# Patient Record
Sex: Male | Born: 1950 | Race: Black or African American | Hispanic: No | Marital: Single | Smoking: Never smoker
Health system: Southern US, Community
[De-identification: ages and names within clinical notes are randomized; demographics above are authoritative.]

---

## 2015-01-26 ENCOUNTER — Ambulatory Visit
Admission: RE | Admit: 2015-01-26 | Discharge: 2015-01-26 | Disposition: A | Discharge status: Home / Self Care | Payer: No Typology Code available for payment source | Source: Ambulatory Visit | Attending: Infectious Disease | Admitting: Infectious Disease

## 2015-01-26 ENCOUNTER — Other Ambulatory Visit: Payer: Self-pay | Admitting: Infectious Disease

## 2015-01-26 DIAGNOSIS — Z139 Encounter for screening, unspecified: Secondary | ICD-10-CM

## 2015-04-13 ENCOUNTER — Other Ambulatory Visit: Payer: Self-pay | Admitting: Physician Assistant

## 2015-04-13 DIAGNOSIS — R221 Localized swelling, mass and lump, neck: Secondary | ICD-10-CM

## 2015-06-13 ENCOUNTER — Ambulatory Visit
Admission: RE | Admit: 2015-06-13 | Discharge: 2015-06-13 | Disposition: A | Discharge status: Home / Self Care | Payer: Medicaid Other | Source: Ambulatory Visit | Attending: Physician Assistant | Admitting: Physician Assistant

## 2015-06-13 ENCOUNTER — Inpatient Hospital Stay: Admission: RE | Admit: 2015-06-13 | Payer: Self-pay | Source: Ambulatory Visit

## 2015-06-13 DIAGNOSIS — R221 Localized swelling, mass and lump, neck: Secondary | ICD-10-CM

## 2015-06-13 MED ORDER — IOPAMIDOL (ISOVUE-300) INJECTION 61%
75.0000 mL | Freq: Once | INTRAVENOUS | Status: AC | PRN
  Administered 2015-06-13: 75 mL via INTRAVENOUS

## 2015-11-26 ENCOUNTER — Telehealth: Payer: Self-pay | Admitting: *Deleted

## 2015-11-26 NOTE — Congregational Nurse Program (Signed)
Congregational Nurse Program Note  Date of Encounter: 10/30/2015 Encounter completed with the assistance of an interpreter.  Client speaks Burmese.   C/O of constipation.  Discussed eating more food high in fiber and increase water intake.  States is experiencing pain from foot to waist.  States pain medication is not working.  Has an appointment 10/18.  Encouraged client to keep appointment and discuss pain Past Medical History: No past medical history on file.  Encounter Details:     CNP Questionnaire - 10/30/15 0946      Patient Demographics   Is this a new or existing patient? New   Patient is considered a/an Refugee   Race Other     Patient Assistance   Location of Patient Assistance Not Applicable   Patient's financial/insurance status Low Income;Medicaid   Uninsured Patient (Orange Research officer, trade unionCard/Care Connects) No   Patient referred to apply for the following financial assistance Not Applicable   Food insecurities addressed Not Applicable   Transportation assistance No   Assistance securing medications No   Educational health offerings Acute disease;Navigating the healthcare system     Encounter Details   Primary purpose of visit Chronic Illness/Condition Visit   Was an Emergency Department visit averted? Not Applicable   Does patient have a medical provider? Yes   Patient referred to Establish PCP   Was a mental health screening completed? (GAINS tool) No   Does patient have dental issues? No   Does patient have vision issues? No   Does your patient have an abnormal blood pressure today? No   Since previous encounter, have you referred patient for abnormal blood pressure that resulted in a new diagnosis or medication change? No   Does your patient have an abnormal blood glucose today? No   Since previous encounter, have you referred patient for abnormal blood glucose that resulted in a new diagnosis or medication change? No   Was there a life-saving intervention made? No

## 2015-11-26 NOTE — Telephone Encounter (Signed)
Called patient via [employer], [place] and spoke with the spouse, Samuel Gordon. They are both in class until 12:30 PM. Given a new patient appt with Dr. Luciana Axeomer for 12/19/15 at 4:15 pm. I will call back tomorrow after they get out of class with directions to [facility] and appt info. Samuel Gordon

## 2015-11-27 NOTE — Telephone Encounter (Signed)
Called patient back via [employer] ID # [id number], Samuel Gordon and he was notified of the appt date, time, and address. He would like a Swahili interpreter versus Samuel Gordon as his wife is french speaking. Samuel Gordon

## 2015-12-19 ENCOUNTER — Ambulatory Visit (INDEPENDENT_AMBULATORY_CARE_PROVIDER_SITE_OTHER): Payer: Medicaid Other | Admitting: Internal Medicine

## 2015-12-19 ENCOUNTER — Encounter: Payer: Self-pay | Admitting: Internal Medicine

## 2015-12-19 DIAGNOSIS — B182 Chronic viral hepatitis C: Secondary | ICD-10-CM | POA: Diagnosis present

## 2015-12-19 NOTE — Progress Notes (Signed)
    [facility]      Reason for Consult: chronic hepatitis C    Referring Physician: PCP    Patient ID: Samuel Gordon, male    DOB: 01/08/1951, 65 y.o.   MRN: [id number]  HPI:   Here for evaluuation as a new patient with hepatitis C.  No records available of confirmation.  He was told about this when he was called by us to make an appointment.  His interview is via interpreter.  He does not recall any previous test for hepatitis C.  No known family members with hepatitis C and he is unaware of how he got it.  No history of blood transfusion, no tattoos, no shared needles. From the [place] originally.  Learning English.  Interested in treatment    No past medical history on file.  Prior to Admission medications   Not on File    No Known Allergies  Social History  Substance Use Topics  . Smoking status: Never Smoker  . Smokeless tobacco: Never Used  . Alcohol use No    FMH: no known liver disease  Review of Systems  Constitutional: negative for fatigue, malaise, anorexia and weight loss Gastrointestinal: negative for nausea and diarrhea Integument/breast: negative for rash Musculoskeletal: negative for myalgias and arthralgias All other systems reviewed and are negative    Constitutional: in no apparent distress and alert  Vitals:   12/19/15 1608  BP: (!) 164/84  Pulse: 99  Temp: 97.8 F (36.6 C)   EYES: anicteric ENMT: no thrush Cardiovascular: Cor RRR Respiratory: CTA B; normal respiratory effort GI: Bowel sounds are normal, liver is not enlarged, spleen is not enlarged Musculoskeletal: no pedal edema noted Skin: negatives: no rash Hematologic: no cervical lad  Assessment: reported hepatitis C.  I will do labs today for confirmation and then consider Mayvret for 8 or 12 weeks.    Plan: 1) labs today 2) schedule elastography RTC after starting medication

## 2015-12-20 LAB — COMPLETE METABOLIC PANEL WITH GFR
ALT: 148 U/L — AB (ref 9–46)
AST: 135 U/L — AB (ref 10–35)
Albumin: 3.7 g/dL (ref 3.6–5.1)
Alkaline Phosphatase: 100 U/L (ref 40–115)
BUN: 16 mg/dL (ref 7–25)
CHLORIDE: 98 mmol/L (ref 98–110)
CO2: 30 mmol/L (ref 20–31)
CREATININE: 1.14 mg/dL (ref 0.70–1.25)
Calcium: 9.5 mg/dL (ref 8.6–10.3)
GFR, Est African American: 78 mL/min (ref >=60)
GFR, Est Non African American: 67 mL/min (ref >=60)
GLUCOSE: 82 mg/dL (ref 65–99)
POTASSIUM: 3.9 mmol/L (ref 3.5–5.3)
SODIUM: 136 mmol/L (ref 135–146)
Total Bilirubin: 0.6 mg/dL (ref 0.2–1.2)
Total Protein: 9.3 g/dL — ABNORMAL HIGH (ref 6.1–8.1)

## 2015-12-20 LAB — CBC WITH DIFFERENTIAL/PLATELET
BASOS PCT: 1 %
Basophils Absolute: 51 cells/uL (ref 0–200)
EOS PCT: 1 %
Eosinophils Absolute: 51 cells/uL (ref 15–500)
HCT: 41 % (ref 38.5–50.0)
Hemoglobin: 14.3 g/dL (ref 13.2–17.1)
LYMPHS PCT: 57 %
Lymphs Abs: 2907 cells/uL (ref 850–3900)
MCH: 32.4 pg (ref 27.0–33.0)
MCHC: 34.9 g/dL (ref 32.0–36.0)
MCV: 92.8 fL (ref 80.0–100.0)
MPV: 9.5 fL (ref 7.5–12.5)
Monocytes Absolute: 510 cells/uL (ref 200–950)
Monocytes Relative: 10 %
NEUTROS PCT: 31 %
Neutro Abs: 1581 cells/uL (ref 1500–7800)
PLATELETS: 142 10*3/uL (ref 140–400)
RBC: 4.42 MIL/uL (ref 4.20–5.80)
RDW: 14.4 % (ref 11.0–15.0)
WBC: 5.1 10*3/uL (ref 3.8–10.8)

## 2015-12-20 LAB — HEPATITIS B SURFACE ANTIBODY,QUALITATIVE: Hep B S Ab: NEGATIVE

## 2015-12-20 LAB — HEPATITIS B CORE ANTIBODY, TOTAL: HEP B C TOTAL AB: REACTIVE — AB

## 2015-12-20 LAB — HIV ANTIBODY (ROUTINE TESTING W REFLEX): HIV: NONREACTIVE

## 2015-12-20 LAB — PROTIME-INR
INR: 1.2 — AB
PROTHROMBIN TIME: 12.2 s — AB (ref 9.0–11.5)

## 2015-12-20 LAB — HEPATITIS B SURFACE ANTIGEN: Hepatitis B Surface Ag: NEGATIVE

## 2015-12-20 LAB — HEPATITIS A ANTIBODY, TOTAL: HEP A TOTAL AB: REACTIVE — AB

## 2015-12-20 LAB — HEPATITIS C ANTIBODY: HCV AB: REACTIVE — AB

## 2015-12-24 LAB — LIVER FIBROSIS, FIBROTEST-ACTITEST
ALT: 145 U/L — AB (ref 9–46)
Alpha-2-Macroglobulin: 476 mg/dL — ABNORMAL HIGH (ref 106–279)
Apolipoprotein A1: 130 mg/dL (ref 94–176)
BILIRUBIN: 0.5 mg/dL (ref 0.2–1.2)
Fibrosis Score: 0.91
GGT: 108 U/L — AB (ref 3–70)
Haptoglobin: 45 mg/dL (ref 43–212)
NECROINFLAMMAT ACT SCORE: 0.86
Reference ID: 1720361

## 2015-12-24 LAB — HCV RNA QUANT RFLX ULTRA OR GENOTYP
HCV QUANT LOG: 5.92 {Log} — AB (ref <1.18)
HCV QUANT: 834431 [IU]/mL — AB (ref <15)

## 2015-12-24 LAB — HEPATITIS C RNA QUANTITATIVE
HCV Quantitative Log: 5.92 {Log} — ABNORMAL HIGH (ref <1.18)
HCV Quantitative: 834431 IU/mL — ABNORMAL HIGH (ref <15)

## 2015-12-27 ENCOUNTER — Encounter: Payer: Self-pay | Admitting: Internal Medicine

## 2015-12-28 LAB — HEPATITIS C GENOTYPE: HCV Genotype: 4

## 2015-12-31 ENCOUNTER — Other Ambulatory Visit: Payer: Self-pay | Admitting: Pharmacist

## 2015-12-31 ENCOUNTER — Other Ambulatory Visit: Payer: Self-pay | Admitting: Internal Medicine

## 2015-12-31 DIAGNOSIS — B182 Chronic viral hepatitis C: Secondary | ICD-10-CM

## 2015-12-31 MED ORDER — GLECAPREVIR-PIBRENTASVIR 100-40 MG PO TABS: 3.0000 | ORAL_TABLET | Freq: Every day | ORAL | 2 refills | Status: DC

## 2015-12-31 NOTE — Progress Notes (Signed)
ma

## 2016-01-02 ENCOUNTER — Ambulatory Visit (INDEPENDENT_AMBULATORY_CARE_PROVIDER_SITE_OTHER): Payer: Medicaid Other | Admitting: Pharmacist Clinician (PhC)/ Clinical Pharmacy Specialist

## 2016-01-02 DIAGNOSIS — B182 Chronic viral hepatitis C: Secondary | ICD-10-CM

## 2016-01-02 NOTE — Progress Notes (Signed)
HPI: Samuel Gordon is a 65 y.o. male who is here with a Nurse, learning disabilitytranslator for his hep c paper work.   Lab Results  Component Value Date   HCVGENOTYPE 4 12/19/2015    Allergies: No Known Allergies  Vitals:    Past Medical History: No past medical history on file.  Social History: Social History   Social History  . Marital status: Single    Spouse name: N/A  . Number of children: N/A  . Years of education: N/A   Social History Main Topics  . Smoking status: Never Smoker  . Smokeless tobacco: Never Used  . Alcohol use No  . Drug use: No  . Sexual activity: Yes    Partners: Female   Other Topics Concern  . Not on file   Social History Narrative  . No narrative on file    Labs: Hep B S Ab (no units)  Date Value  12/19/2015 NEG   Hepatitis B Surface Ag (no units)  Date Value  12/19/2015 NEGATIVE   HCV Ab (no units)  Date Value  12/19/2015 REACTIVE (A)    Lab Results  Component Value Date   HCVGENOTYPE 4 12/19/2015    Hepatitis C RNA quantitative Latest Ref Rng & Units 12/19/2015 12/19/2015  HCV Quantitative <15 IU/mL 834,431(H) 834,431(H)  HCV Quantitative Log <1.18 log 10 5.92(H) 5.92(H)    AST (U/L)  Date Value  12/19/2015 135 (H)   ALT (U/L)  Date Value  12/19/2015 148 (H)  12/19/2015 145 (H)   INR (no units)  Date Value  12/19/2015 1.2 (H)    CrCl: CrCl cannot be calculated (Unknown ideal weight.).  Fibrosis Score: F4 as assessed by Fibrosure  Child-Pugh Score: Class A  Previous Treatment Regimen: None  Assessment: Samuel Gordon is a gentleman from the [place] who was recently eval by Dr. Luciana Axeomer for his geno 4 hep C. He stated that he has medicaid until about July of next year so it's an opportune time to treat his hep C now. He is here today to sign the paper work so we can submitted to Medicaid. Since we know that communication will be an issue if he is approved, we are going to bring him back monthly to address compliance and he can pick  up his med at the clinic at the same time. We also counsel him on how to take it today also. He seems very appreciative of us helping him. He is F4 so we'll apply for 12 wks of Mavyret.   Recommendations:  Apply for Mavyret 3 tab qdwc x12 wks Bring back monthly to pick up meds and labs  Ulyses Southwardham, Jacquelynne Guedes UalapueQuang, Vermont[place]arm.D., BCPS, AAHIVP Clinical Infectious Disease Pharmacist [facility] 01/02/2016, 8:39 PM

## 2016-01-04 MED FILL — MAVYRET 100-40 MG TABS: 100-40 | 28 days supply | Qty: 84 | Fill #0

## 2016-01-07 ENCOUNTER — Encounter: Payer: Self-pay | Admitting: Pharmacy Technician

## 2016-01-18 ENCOUNTER — Ambulatory Visit
Admission: RE | Admit: 2016-01-18 | Discharge: 2016-01-18 | Disposition: A | Discharge status: Home / Self Care | Payer: No Typology Code available for payment source | Source: Ambulatory Visit | Attending: Infectious Disease | Admitting: Infectious Disease

## 2016-01-18 ENCOUNTER — Other Ambulatory Visit: Payer: Self-pay | Admitting: Infectious Disease

## 2016-01-18 DIAGNOSIS — Z09 Encounter for follow-up examination after completed treatment for conditions other than malignant neoplasm: Secondary | ICD-10-CM

## 2016-01-29 MED FILL — MAVYRET 100-40 MG TABS: 100-40 | 28 days supply | Qty: 84 | Fill #1

## 2016-01-30 ENCOUNTER — Ambulatory Visit (INDEPENDENT_AMBULATORY_CARE_PROVIDER_SITE_OTHER): Payer: Medicaid Other | Admitting: Pharmacist Clinician (PhC)/ Clinical Pharmacy Specialist

## 2016-01-30 DIAGNOSIS — B182 Chronic viral hepatitis C: Secondary | ICD-10-CM

## 2016-01-30 NOTE — Progress Notes (Signed)
HPI: Alcus Dadgongo Case is a 66 y.o. male who is here for his hep C visit with pharmacy.   Lab Results  Component Value Date   HCVGENOTYPE 4 12/19/2015    Allergies: No Known Allergies  Vitals:    Past Medical History: No past medical history on file.  Social History: Social History   Social History  . Marital status: Single    Spouse name: N/A  . Number of children: N/A  . Years of education: N/A   Social History Main Topics  . Smoking status: Never Smoker  . Smokeless tobacco: Never Used  . Alcohol use No  . Drug use: No  . Sexual activity: Yes    Partners: Female   Other Topics Concern  . Not on file   Social History Narrative  . No narrative on file    Labs: Hep B S Ab (no units)  Date Value  12/19/2015 NEG   Hepatitis B Surface Ag (no units)  Date Value  12/19/2015 NEGATIVE   HCV Ab (no units)  Date Value  12/19/2015 REACTIVE (A)    Lab Results  Component Value Date   HCVGENOTYPE 4 12/19/2015    Hepatitis C RNA quantitative Latest Ref Rng & Units 12/19/2015 12/19/2015  HCV Quantitative <15 IU/mL 834,431(H) 834,431(H)  HCV Quantitative Log <1.18 log 10 5.92(H) 5.92(H)    AST (U/L)  Date Value  12/19/2015 135 (H)   ALT (U/L)  Date Value  12/19/2015 148 (H)  12/19/2015 145 (H)   INR (no units)  Date Value  12/19/2015 1.2 (H)    CrCl: CrCl cannot be calculated (Patient's most recent lab result is older than the maximum 21 days allowed.).  Fibrosis Score: F4 as assessed by Fibrosure  Child-Pugh Score: Class A  Previous Treatment Regimen: None  Assessment: Deryl started his Mavyret on 12/29. 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 Dr. Luciana Axeomer discuss with Dr Gordy CouncilmanBachman and it's not TB. He has not missed any doses of his Mavyret so far. He take all 3 tablets daily with food. This only side effects that he mentioned was tiredness. Told him that it's one of the side effects of Mavyret.  He said he felt better after starting the medication. Handed him his second month of Mavyret today and set him up to come back on 2/5 to get his third month. We will draw his VL today so we can submit to medicaid.   Since he has F4, I'll schedule him to f/u with Dr. Luciana Axeomer after he is done with meds so he can be referred to GI.   Recommendations:  Cont Mavyret 3 tab qday with food x 64mo Hep C VL today Come back on 2/5 to pick up 3rd month F/u with Dr. Luciana Axeomer after course   [place] ParkMinh Pham, VermontPharm.D., BCPS, AAHIVP Clinical Infectious Disease Pharmacist [facility] 01/30/2016, 9:50 AM

## 2016-01-30 NOTE — Patient Instructions (Addendum)
Endelea kuchukua vidonge 3 kila siku na chakula ili kutibu hepatitis C yako  Rudi na unione saa 02/25/16 saa 2 jioni ili kupata mwezi wako wa tatu wa dawa

## 2016-02-01 LAB — HEPATITIS C RNA QUANTITATIVE: HCV Quantitative: NOT DETECTED IU/mL (ref <15)

## 2016-02-20 MED FILL — MAVYRET 100-40 MG TABS: 100-40 | 28 days supply | Qty: 84 | Fill #2

## 2016-02-25 ENCOUNTER — Ambulatory Visit (INDEPENDENT_AMBULATORY_CARE_PROVIDER_SITE_OTHER): Payer: Medicaid Other | Admitting: Pharmacist Clinician (PhC)/ Clinical Pharmacy Specialist

## 2016-02-25 DIAGNOSIS — B182 Chronic viral hepatitis C: Secondary | ICD-10-CM | POA: Diagnosis present

## 2016-02-25 NOTE — Patient Instructions (Signed)
Samuel ShinerHakikisha kumaliza miezi 3 kamili ya dawa hii  Rudi kwa maabara wakati unamaliza dawa yako na katika miezi mitatu. Kisha kufuata na mimi kwa ajili ya kutembelea tiba.

## 2016-02-25 NOTE — Progress Notes (Signed)
HPI: Samuel Gordon is a 66 y.o. male who is here to f/u with pharmacy and pick up his meds for his hep C.   Lab Results  Component Value Date   HCVGENOTYPE 4 12/19/2015    Allergies: No Known Allergies  Vitals:    Past Medical History: No past medical history on file.  Social History: Social History   Social History  . Marital status: Single    Spouse name: N/A  . Number of children: N/A  . Years of education: N/A   Social History Main Topics  . Smoking status: Never Smoker  . Smokeless tobacco: Never Used  . Alcohol use No  . Drug use: No  . Sexual activity: Yes    Partners: Female   Other Topics Concern  . Not on file   Social History Narrative  . No narrative on file    Labs: Hep B S Ab (no units)  Date Value  12/19/2015 NEG   Hepatitis B Surface Ag (no units)  Date Value  12/19/2015 NEGATIVE   HCV Ab (no units)  Date Value  12/19/2015 REACTIVE (A)    Lab Results  Component Value Date   HCVGENOTYPE 4 12/19/2015    Hepatitis C RNA quantitative Latest Ref Rng & Units 01/30/2016 12/19/2015 12/19/2015  HCV Quantitative <15 IU/mL Not Detected 834,431(H) 834,431(H)  HCV Quantitative Log <1.18 log 10 NOT CALC 5.92(H) 5.92(H)    AST (U/L)  Date Value  12/19/2015 135 (H)   ALT (U/L)  Date Value  12/19/2015 148 (H)  12/19/2015 145 (H)   INR (no units)  Date Value  12/19/2015 1.2 (H)    CrCl: CrCl cannot be calculated (Patient's most recent lab result is older than the maximum 21 days allowed.).  Fibrosis Score: F4 as assessed by Fibrosure  Child-Pugh Score: Class A  Previous Treatment Regimen: None  Assessment: Samuel Gordon is here for his pharmacy follow up. He is really religious about taking his meds. He has not missed any doses. Handed him the 3rd mo supply of his Mavyret today. Since he has F4 and his platelet <150k, Dr. Luciana Axeomer would like him to f/u with pharmacy for the cure visit and he will refer him to GI at that time. Will check  his EOT and SVR12 then f/u with us. All of his appts are made and explained through a translator.   Recommendations:  Cont Mavyret for 3 full months F/u GEX/BMW41EOT/SVR12 VL, CBC, CMET F/u with pharmacy for the cure visit then Dr. Luciana Axeomer will refer to GI  Samuel Gordon, Pharm.D., BCPS, AAHIVP Clinical Infectious Disease Pharmacist [facility] 02/25/2016, 2:52 PM

## 2016-04-07 ENCOUNTER — Other Ambulatory Visit: Payer: Medicaid Other

## 2016-04-07 DIAGNOSIS — B182 Chronic viral hepatitis C: Secondary | ICD-10-CM

## 2016-04-07 LAB — CBC
HCT: 38.8 % (ref 38.5–50.0)
Hemoglobin: 13.5 g/dL (ref 13.2–17.1)
MCH: 32.9 pg (ref 27.0–33.0)
MCHC: 34.8 g/dL (ref 32.0–36.0)
MCV: 94.6 fL (ref 80.0–100.0)
MPV: 9.8 fL (ref 7.5–12.5)
Platelets: 163 10*3/uL (ref 140–400)
RBC: 4.1 MIL/uL — AB (ref 4.20–5.80)
RDW: 13.7 % (ref 11.0–15.0)
WBC: 5.1 10*3/uL (ref 3.8–10.8)

## 2016-04-07 LAB — COMPLETE METABOLIC PANEL WITH GFR
ALBUMIN: 3.9 g/dL (ref 3.6–5.1)
ALK PHOS: 84 U/L (ref 40–115)
ALT: 18 U/L (ref 9–46)
AST: 28 U/L (ref 10–35)
BUN: 18 mg/dL (ref 7–25)
CHLORIDE: 102 mmol/L (ref 98–110)
CO2: 29 mmol/L (ref 20–31)
Calcium: 9.7 mg/dL (ref 8.6–10.3)
Creat: 1.04 mg/dL (ref 0.70–1.25)
GFR, EST NON AFRICAN AMERICAN: 74 mL/min (ref >=60)
GFR, Est African American: 86 mL/min (ref >=60)
GLUCOSE: 88 mg/dL (ref 65–99)
POTASSIUM: 4 mmol/L (ref 3.5–5.3)
SODIUM: 139 mmol/L (ref 135–146)
Total Bilirubin: 0.5 mg/dL (ref 0.2–1.2)
Total Protein: 8.3 g/dL — ABNORMAL HIGH (ref 6.1–8.1)

## 2016-04-09 LAB — HEPATITIS C RNA QUANTITATIVE
HCV QUANT LOG: NOT DETECTED {Log_IU}/mL
HCV Quantitative: <15 IU/mL

## 2016-06-04 ENCOUNTER — Telehealth: Payer: Self-pay | Admitting: *Deleted

## 2016-06-04 NOTE — Telephone Encounter (Signed)
Notes requested from referring provider at [facility]. Last office notes with MD and pharmacist faxed. Confirmation received. Wendall MolaJacqueline Cockerham CMA

## 2016-07-02 NOTE — Congregational Nurse Program (Signed)
Congregational Nurse Program Note  Date of Encounter: 07/01/2016  Past Medical History: No past medical history on file.  Encounter Details:     CNP Questionnaire - 07/02/16 1244      Patient Demographics   Is this a new or existing patient? Existing   Patient is considered a/an Refugee   Race African     Patient Assistance   Location of Patient Assistance Not Applicable   Patient's financial/insurance status [employer] Research officer, trade unionCard/Care Connects   Uninsured Patient ([employer] Card/Care Connects) Yes   Interventions Counseled to make appt. with provider   Patient referred to apply for the following financial assistance Not Applicable   Food insecurities addressed Not Applicable   Transportation assistance No   Assistance securing medications No   Educational health offerings Nutrition;Other     Encounter Details   Primary purpose of visit Education/Health Concerns   Was an Emergency Department visit averted? Not Applicable   Does patient have a medical provider? Yes   Patient referred to Follow up with established PCP   Was a mental health screening completed? (GAINS tool) No   Does patient have dental issues? No   Does patient have vision issues? No   Does your patient have an abnormal blood pressure today? No   Since previous encounter, have you referred patient for abnormal blood pressure that resulted in a new diagnosis or medication change? No   Does your patient have an abnormal blood glucose today? No   Since previous encounter, have you referred patient for abnormal blood glucose that resulted in a new diagnosis or medication change? No   Was there a life-saving intervention made? No     Brief office visit requesting to check blood sugar, weight and B/P. CBG 103 fasting. BMI underweight.  Denies any health or dietary concerns other than weight. [facility] to [facility] [address] Advised on increasing  proteins in diet and return for  weight re-check every 2 weeks after physician consult.  Samuel LuzMarietta Akosua Constantine, RN/CN

## 2016-07-07 ENCOUNTER — Other Ambulatory Visit: Payer: Medicaid Other

## 2016-07-07 DIAGNOSIS — B182 Chronic viral hepatitis C: Secondary | ICD-10-CM

## 2016-07-07 LAB — CBC
HEMATOCRIT: 39.3 % (ref 38.5–50.0)
HEMOGLOBIN: 13.5 g/dL (ref 13.2–17.1)
MCH: 32.2 pg (ref 27.0–33.0)
MCHC: 34.4 g/dL (ref 32.0–36.0)
MCV: 93.8 fL (ref 80.0–100.0)
MPV: 9.6 fL (ref 7.5–12.5)
Platelets: 136 10*3/uL — ABNORMAL LOW (ref 140–400)
RBC: 4.19 MIL/uL — ABNORMAL LOW (ref 4.20–5.80)
RDW: 14.1 % (ref 11.0–15.0)
WBC: 6.1 10*3/uL (ref 3.8–10.8)

## 2016-07-08 LAB — COMPLETE METABOLIC PANEL WITH GFR
ALBUMIN: 3.9 g/dL (ref 3.6–5.1)
ALK PHOS: 86 U/L (ref 40–115)
ALT: 17 U/L (ref 9–46)
AST: 25 U/L (ref 10–35)
BILIRUBIN TOTAL: 0.4 mg/dL (ref 0.2–1.2)
BUN: 16 mg/dL (ref 7–25)
CALCIUM: 9.7 mg/dL (ref 8.6–10.3)
CO2: 25 mmol/L (ref 20–31)
Chloride: 104 mmol/L (ref 98–110)
Creat: 1.17 mg/dL (ref 0.70–1.25)
GFR, EST AFRICAN AMERICAN: 75 mL/min (ref >=60)
GFR, EST NON AFRICAN AMERICAN: 65 mL/min (ref >=60)
Glucose, Bld: 63 mg/dL — ABNORMAL LOW (ref 65–99)
POTASSIUM: 4 mmol/L (ref 3.5–5.3)
SODIUM: 139 mmol/L (ref 135–146)
TOTAL PROTEIN: 8.1 g/dL (ref 6.1–8.1)

## 2016-07-09 ENCOUNTER — Telehealth: Payer: Self-pay | Admitting: *Deleted

## 2016-07-09 NOTE — Telephone Encounter (Signed)
Patient had [facility] while on HCV treatment.  This is offered for free if he wants it. Elsie LincolnCassandra Brown from [facility] calling to see if this LTBI treatment opportunity can be discussed with patient at his upcoming cure visit on 6/28.  This will be the last offer for treatment. Please contact Cassandra at [phone] when patient is in pharmacy clinic for his appointment 6/28 at 2:30. Andree CossHowell, Dujuan Stankowski M, RN

## 2016-07-10 LAB — HEPATITIS C RNA QUANTITATIVE
HCV QUANT LOG: NOT DETECTED {Log_IU}/mL
HCV QUANT: NOT DETECTED [IU]/mL

## 2016-07-17 ENCOUNTER — Ambulatory Visit (INDEPENDENT_AMBULATORY_CARE_PROVIDER_SITE_OTHER): Payer: Medicaid Other | Admitting: Pharmacist

## 2016-07-17 DIAGNOSIS — B182 Chronic viral hepatitis C: Secondary | ICD-10-CM | POA: Diagnosis not present

## 2016-07-17 NOTE — Progress Notes (Signed)
HPI: Alcus Dadgongo Rotan is a 66 y.o. male who presents to the [facility] pharmacy clinic for his Hep C cure visit. He has genotype 4, F4 fibrosis, and finished 12 weeks of Mavyret in the middle of March.   Lab Results  Component Value Date   HCVGENOTYPE 4 12/19/2015    Allergies: No Known Allergies  Past Medical History: No past medical history on file.  Social History: Social History   Social History  . Marital status: Single    Spouse name: N/A  . Number of children: N/A  . Years of education: N/A   Social History Main Topics  . Smoking status: Never Smoker  . Smokeless tobacco: Never Used  . Alcohol use No  . Drug use: No  . Sexual activity: Yes    Partners: Female   Other Topics Concern  . Not on file   Social History Narrative  . No narrative on file    Labs: Hep B S Ab (no units)  Date Value  12/19/2015 NEG   Hepatitis B Surface Ag (no units)  Date Value  12/19/2015 NEGATIVE   HCV Ab (no units)  Date Value  12/19/2015 REACTIVE (A)    Lab Results  Component Value Date   HCVGENOTYPE 4 12/19/2015    Hepatitis C RNA quantitative Latest Ref Rng & Units 07/07/2016 04/07/2016 01/30/2016 12/19/2015 12/19/2015  HCV Quantitative NOT DETECTED IU/mL <15 NOT DETECTED <15 NOT DETECTED Not Detected 834,431(H) 834,431(H)  HCV Quantitative Log NOT DETECTED Log IU/mL <1.18 NOT DETECTED <1.18 NOT DETECTED NOT CALC 5.92(H) 5.92(H)    AST (U/L)  Date Value  07/07/2016 25  04/07/2016 28  12/19/2015 135 (H)   ALT (U/L)  Date Value  07/07/2016 17  04/07/2016 18  12/19/2015 148 (H)  12/19/2015 145 (H)   INR (no units)  Date Value  12/19/2015 1.2 (H)    CrCl: CrCl cannot be calculated (Unknown ideal weight.).  Fibrosis Score: F4 as assessed by fibrosure   Child-Pugh Score: A  Previous Treatment Regimen: None  Assessment: Rayland is here today for his Hep C cure visit.  The encounter was provided through interpreter reference [phone]. He finished 12 weeks of  Mavyret back in March and is doing quite well.  He never missed a dose and completed all of his treatment. His SVR12 lab was undetectable. I will try and coordinate referring him to GI.  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 I tried calling Cassandra and she did not answer.  I left her a voicemail stating that he was ready for treatment but he did not have a phone that is currently working.  I gave him her number and told him to call her.  He said he would tomorrow.    Plans: - Cured of Hep C - Needs GI referral - RTC PRN  Cassie L. Kuppelweiser, PharmD, CPP Infectious Diseases Clinical Pharmacist [facility] 07/17/2016, 3:06 PM

## 2016-07-17 NOTE — Patient Instructions (Signed)
You are cured of your Hep C!  Please call this number [phone] for help with your latent TB.

## 2016-08-05 ENCOUNTER — Telehealth: Payer: Self-pay | Admitting: *Deleted

## 2016-08-05 NOTE — Telephone Encounter (Signed)
Called to initiate a prior authorization for ultrasound and his Medicaid has possibly expired. Case # [id number]; this has been approved, however it was sent to the eligibility department because Medicaid could not verify if patient's insurance is currently active. So this ultrasound is pending.

## 2016-08-05 NOTE — Telephone Encounter (Signed)
-----   Message from Gardiner Barefootobert W Comer, MD sent at 07/21/2016  8:48 AM EDT ----- Can we get the ultrasound with elastography on him before considering GI?  This will help determine if he needs screening later and GI referral.  thanks

## 2016-08-11 ENCOUNTER — Telehealth: Payer: Self-pay | Admitting: *Deleted

## 2016-08-11 NOTE — Telephone Encounter (Signed)
Patient no longer has insurance. Samuel Gordon

## 2016-08-11 NOTE — Telephone Encounter (Signed)
[facility] stopper coverage 07/19/16.  Abdominal Ultrasound will need another type of coverage.  Message given to J. Jennye Moccasinockerham, CMA.

## 2016-08-12 NOTE — Telephone Encounter (Signed)
Ok.  Will skip the ultrasound for now until he has orange card, assistance.  thanks

## 2016-12-22 NOTE — Congregational Nurse Program (Signed)
Congregational Nurse Program Note  Date of Encounter: 12/22/2016  Past Medical History: No past medical history on file.  Encounter Details: CNP Questionnaire - 12/22/16 1100      Questionnaire   Patient Status  Refugee    Race  African    Location Patient Served At  Not Applicable    Insurance  Medicaid    Uninsured  Not Applicable    Food  No food insecurities    Housing/Utilities  Yes, have permanent housing    Transportation  No transportation needs    Interpersonal Safety  Yes, feel physically and emotionally safe where you currently live    Medication  No medication insecurities    Medical Provider  Yes    Referrals  Primary Care Provider/Clinic    ED Visit Averted  Not Applicable    Life-Saving Intervention Made  Not Applicable     Mr Samuel Gordon came to the clinic in need of appointment to see PCP for annual check up.[facility] and [facility] was set up for Dec 11th @ 1045. He also has Dental problems and he is requesting to see a dentist. I have [facility] [phone]

## 2017-08-31 NOTE — Congregational Nurse Program (Signed)
Client at [facility]. He needed help with his form concerning his medicare. Was able to get his enrollment done. And gave him instruction to follow up with nurse close to November.He is to call with any changes [phone]( nurse will assist). Primary Care Catalina PizzaCynthia Warden [phone] Alveena Taira Rakayla Ricklefs RN BSN CN [facility] PhD. [phone].

## 2017-10-23 NOTE — Congregational Nurse Program (Signed)
Mr Samuel Gordon came in for bp check. He has no other concerns at this time. Arman Bogus RN BSN PCCN [phone]

## 2018-03-10 ENCOUNTER — Other Ambulatory Visit: Payer: Self-pay | Admitting: Family Medicine

## 2018-03-10 ENCOUNTER — Ambulatory Visit
Admission: RE | Admit: 2018-03-10 | Discharge: 2018-03-10 | Disposition: A | Discharge status: Home / Self Care | Payer: Self-pay | Source: Ambulatory Visit | Attending: Family Medicine | Admitting: Family Medicine

## 2018-03-10 DIAGNOSIS — M25561 Pain in right knee: Secondary | ICD-10-CM

## 2018-03-10 DIAGNOSIS — M25562 Pain in left knee: Secondary | ICD-10-CM

## 2018-11-10 DIAGNOSIS — R739 Hyperglycemia, unspecified: Secondary | ICD-10-CM

## 2018-11-10 LAB — GLUCOSE, POCT (MANUAL RESULT ENTRY): POC Glucose: 142 mg/dl — AB (ref 70–99)

## 2018-11-10 NOTE — Congregational Nurse Program (Signed)
  Dept: [place] Nurse Program Note  Date of Encounter: 11/10/2018  Past Medical History: No past medical history on file.  Encounter Details: CNP Questionnaire - 11/10/18 1118      Questionnaire   Race  African    Location Patient Served At  [employer]  No food insecurities    Housing/Utilities  Yes, have permanent housing    Transportation  No transportation needs    Interpersonal Safety  Yes, feel physically and emotionally safe where you currently live    Medication  No medication insecurities    Medical Provider  Yes    Referrals  Not Applicable    ED Visit Averted  Not Applicable    Life-Saving Intervention Made  Not Applicable     Mr. Samuel Gordon came in for blood pressure and blood sugar check. Same done, he has no other needs or concerns at this time. Health education provided on diet and exercise and medication compliance. [name], RN, BSN [place], CNP [phone]

## 2019-01-05 NOTE — Congregational Nurse Program (Signed)
  Dept: [place] Nurse Program Note  Date of Encounter: 12/28/2018  Past Medical History: No past medical history on file.  Encounter Details:  Mr Samuel Gordon says that has not recieved his medicaid card yet. The case worker/community health worker will follow up on his behalf. Earlie Server Blaine Guiffre Rn BSn PCCN  [phone]

## 2019-03-12 ENCOUNTER — Ambulatory Visit: Payer: Medicaid Other | Attending: Internal Medicine

## 2019-03-12 DIAGNOSIS — Z23 Encounter for immunization: Secondary | ICD-10-CM

## 2019-03-12 NOTE — Progress Notes (Signed)
   Covid-19 Vaccination Clinic  Name:  [name]    MRN: [id number] DOB: 01-Aug-1950  03/12/2019  [name] was observed post Covid-19 immunization for 15 minutes without incidence. He was provided with Vaccine Information Sheet and instruction to access the V-Safe system.   [name] was instructed to call 911 with any severe reactions post vaccine: Marland Kitchen Difficulty breathing  . Swelling of your face and throat  . A fast heartbeat  . A bad rash all over your body  . Dizziness and weakness    Immunizations Administered    Name Date Dose VIS Date Route   Pfizer COVID-19 Vaccine 03/12/2019 10:26 AM 0.3 mL 12/31/2018 Intramuscular   Manufacturer: [employer], [employer]   Lot: [id number]   NDC: [address]-6

## 2019-03-29 ENCOUNTER — Other Ambulatory Visit: Payer: Self-pay

## 2019-03-29 DIAGNOSIS — R7309 Other abnormal glucose: Secondary | ICD-10-CM

## 2019-03-29 LAB — GLUCOSE, POCT (MANUAL RESULT ENTRY): POC Glucose: 100 mg/dl — AB (ref 70–99)

## 2019-03-29 NOTE — Congregational Nurse Program (Signed)
  Dept: [phone]   Congregational Nurse Program Note  Date of Encounter: 03/29/2019  Past Medical History: No past medical history on file.  Encounter Details: CNP Questionnaire - 03/29/19 1121      Questionnaire   Patient Status  Refugee    Race  African    [facility]    Insurance  Medicaid    Uninsured  Not Applicable    Food  No food insecurities    Housing/Utilities  Yes, have permanent housing    Transportation  No transportation needs    Interpersonal Safety  Yes, feel physically and emotionally safe where you currently live    Medication  No medication insecurities    Medical Provider  Yes    Referrals  Medicaid    ED Visit Averted  Not Applicable    Life-Saving Intervention Made  Not Applicable     Mr Samuel Gordon came in for blood pressure check. Same done. Health education provided on diet and exercise. Arman Bogus RN BSN [facility] [phone]

## 2019-04-05 ENCOUNTER — Ambulatory Visit: Payer: Medicaid Other | Attending: Internal Medicine

## 2019-04-05 DIAGNOSIS — Z23 Encounter for immunization: Secondary | ICD-10-CM

## 2019-04-05 NOTE — Progress Notes (Signed)
   Covid-19 Vaccination Clinic  Name:  Samuel Gordon    MRN: [id number] DOB: 09-Jul-1950  04/05/2019  Mr. Otting was observed post Covid-19 immunization for 15 minutes without incident. He was provided with Vaccine Information Sheet and instruction to access the V-Safe system.   Mr. Hansen was instructed to call 911 with any severe reactions post vaccine: Marland Kitchen Difficulty breathing  . Swelling of face and throat  . A fast heartbeat  . A bad rash all over body  . Dizziness and weakness   Immunizations Administered    Name Date Dose VIS Date Route   Pfizer COVID-19 Vaccine 04/05/2019  9:51 AM 0.3 mL 12/31/2018 Intramuscular   Manufacturer: [employer], [employer]   Lot: [id number]   NDC: [address]-4

## 2019-05-25 NOTE — Congregational Nurse Program (Signed)
  Dept: [phone]   Congregational Nurse Program Note  Date of Encounter: 05/25/2019  Past Medical History: No past medical history on file.  Encounter Details: CNP Questionnaire - 05/25/19 1204      Questionnaire   Patient Status  Refugee    Race  African    [facility]  [facility]    Insurance  Medicaid    Uninsured  Not Applicable    Food  No food insecurities    Housing/Utilities  Yes, have permanent housing    Transportation  No transportation needs    Interpersonal Safety  Yes, feel physically and emotionally safe where you currently live    Medication  No medication insecurities    Medical Provider  Yes    Referrals  Primary Care Provider/Clinic    ED Visit Averted  Yes    Life-Saving Intervention Made  Not Applicable     Client came in c/o right shoulder pain and elbow. Appointment done for May 26th at 1030am with primary care office. Cleint verbalizes understanding of he date time and location of the appointment. Nicole Cella Jervon Ream RN BSN [facility]  [phone]

## 2019-06-22 NOTE — Congregational Nurse Program (Signed)
  Dept: [phone]   Congregational Nurse Program Note  Date of Encounter: 06/22/2019  Past Medical History: No past medical history on file.  Encounter Details: CNP Questionnaire - 06/22/19 1313      Questionnaire   Patient Status  Refugee    Race  African    [facility]  [facility]    Insurance  Medicaid    Uninsured  Not Applicable    Food  No food insecurities    Housing/Utilities  Yes, have permanent housing    Transportation  No transportation needs    Interpersonal Safety  Yes, feel physically and emotionally safe where you currently live    Medication  No medication insecurities    Medical Provider  Yes    Referrals  Not Applicable;Primary Care Provider/Clinic    ED Visit Averted  Yes    Life-Saving Intervention Made  Not Applicable      Client came in for blood pressure check. Same done. I have educated him of heart healthy diet and exercise.He has no other concerns at this time.   Nicole Cella Kasten Leveque RN BSN PCCn [phone]

## 2019-07-12 NOTE — Congregational Nurse Program (Signed)
  Dept: [phone]   Congregational Nurse Program Note  Date of Encounter: 07/12/2019  Past Medical History: No past medical history on file.  Encounter Details:  CNP Questionnaire - 07/12/19 1100      Questionnaire   Patient Status Refugee    Race African    [facility] S[facility] At [facility]    Insurance Medicaid    Uninsured Not Applicable    Food No food insecurities    Housing/Utilities Yes, have permanent housing    Transportation No transportation needs    Interpersonal Safety Yes, feel physically and emotionally safe where you currently live    Medication No medication insecurities    Medical Provider Yes    Referrals Not Applicable;Primary Care Provider/Clinic    ED Visit Averted Yes    Life-Saving Intervention Made Not Applicable         Client came in for blood pressure check.health education provided on heart healthy diet and exercise.  Nicole Cella Sameera Betton RN BSN P[facility] [phone]

## 2019-08-30 NOTE — Congregational Nurse Program (Signed)
  Dept: [phone]   Congregational Nurse Program Note  Date of Encounter: 08/30/2019  Past Medical History: No past medical history on file.  Encounter Details:  CNP Questionnaire - 08/30/19 1206      Questionnaire   Patient Status Refugee    Race African    [facility]    Insurance Medicaid    Uninsured Not Applicable    Food No food insecurities    Housing/Utilities Yes, have permanent housing    Transportation No transportation needs    Interpersonal Safety Yes, feel physically and emotionally safe where you currently live    Medication No medication insecurities    Medical Provider Yes    Referrals Not Applicable;Primary Care Provider/Clinic    ED Visit Averted Not Applicable    Life-Saving Intervention Made Not Applicable         Client came in for blood pressure check. Same done.  Nicole Cella Latise Dilley RN BSn P[facility] [phone]

## 2019-10-12 NOTE — Congregational Nurse Program (Signed)
  Dept: [phone]   Congregational Nurse Program Note  Date of Encounter: 10/12/2019  Past Medical History: No past medical history on file.  Encounter Details:  CNP Questionnaire - 10/12/19 1301      Questionnaire   Patient Status Refugee    Race African    [facility]    Insurance Medicaid    Uninsured Not Applicable    Food No food insecurities    Housing/Utilities Yes, have permanent housing    Transportation No transportation needs    Interpersonal Safety Yes, feel physically and emotionally safe where you currently live    Medication No medication insecurities    Medical Provider Yes    Referrals Primary Care Provider/Clinic    ED Visit Averted Not Applicable    Life-Saving Intervention Made Not Applicable         Mr Johnte came in for BP check. today its 149/92. Health education provided on low salt diet and exercise.I will call PCP for appointment as well. Nicole Cella Rolla Kedzierski RN BSN PCCn  [facility] [phone]-cell

## 2019-10-18 NOTE — Congregational Nurse Program (Signed)
  Dept: [phone]   Congregational Nurse Program Note  Date of Encounter: 10/18/2019  Past Medical History: No past medical history on file.  Encounter Details: Client Came in for Blood Pressure Check which is elevated 145/82. Previous readings have been elevated. Health Education Provide on low salt diet and exercise.  Called Client PCP appointment made [phone] @4pm  - , RN BSN [facility]  [facility]. [facility] [phone]-Office

## 2019-10-18 NOTE — Progress Notes (Signed)
This encounter was created in error - please disregard.  This encounter was created in error - please disregard.

## 2019-11-08 NOTE — Congregational Nurse Program (Signed)
  Dept: [phone]   Congregational Nurse Program Note  Date of Encounter: 11/08/2019  Past Medical History: No past medical history on file.  Encounter Details: Client came in for blood pressure check. He also needed assistance with swahili interpretation for his immigration interview/screening. Same done. He has appointment to see PCP for blood pressure in November.Emphasized heart health low salt diet. No other concerns at this time. Nicole Cella Calil Amor RN BSN PCC[facility]. [phone]

## 2019-11-29 NOTE — Congregational Nurse Program (Signed)
  Dept: [phone]   Congregational Nurse Program Note  Date of Encounter: 11/29/2019  Past Medical History: No past medical history on file.  Encounter Details: Client returned for blood pressure check. Same Done. Also assisted with reading and understanding immigration letters and appointments.  Reminded him of appointment with PCP on 12/02/19 at 4.00pm.He verbalizes understanding. Nicole Cella Charlii Yost Rn BSn PCCN [facility] [phone]

## 2019-12-06 NOTE — Congregational Nurse Program (Signed)
  Dept: [phone]   Congregational Nurse Program Note  Date of Encounter: 12/06/2019  Past Medical History: No past medical history on file.  Encounter Details:  Client came in for blood pressure check. Same done. I have reminded him of tomorrow`s appointment with [employer] Immigration office. Congregational nurse office will provide Transportation. Nicole Cella Janecia Palau RN BSN [facility]  [phone]

## 2019-12-07 ENCOUNTER — Telehealth: Payer: Self-pay

## 2019-12-07 NOTE — Telephone Encounter (Signed)
  Client requested transportation assistance for immigration appointment with Florence Canner at the [place] office. Arman Bogus RN BSN PCCN  [facility] [phone] [phone]

## 2019-12-07 NOTE — Congregational Nurse Program (Signed)
  Dept: [phone]   Congregational Nurse Program Note  Date of Encounter: 12/07/2019  Past Medical History: No past medical history on file.  Encounter Details:  Mr Newstrom here for blood pressure check. Same done. Nicole Cella Trinaty Bundrick Rn BSN P[facility]  [facility] [phone]

## 2019-12-10 ENCOUNTER — Ambulatory Visit (INDEPENDENT_AMBULATORY_CARE_PROVIDER_SITE_OTHER): Payer: Medicaid Other | Admitting: Family Medicine

## 2019-12-10 ENCOUNTER — Other Ambulatory Visit: Payer: Self-pay

## 2019-12-10 DIAGNOSIS — Z87828 Personal history of other (healed) physical injury and trauma: Secondary | ICD-10-CM

## 2019-12-10 NOTE — Progress Notes (Signed)
Patient was seen and examined for N-648.  °Samuel Slutsky, MD  °Family Medicine Teaching Service  ° °

## 2019-12-13 NOTE — Congregational Nurse Program (Signed)
  Dept: [phone]   Congregational Nurse Program Note  Date of Encounter: 12/13/2019  Past Medical History: No past medical history on file.  Encounter Details:  Client came in for Blood Pressure Check. Client will Return next week for another check. Kaley Jutras [facility] [phone]-Office

## 2019-12-21 NOTE — Congregational Nurse Program (Signed)
  Dept: [phone]   Congregational Nurse Program Note  Date of Encounter: 12/21/2019  Past Medical History: No past medical history on file.  Encounter Details:   Client is here for blood pressure Check.Same done. Arman Bogus RN BSN PCC[facility]  [facility]  [phone]-office

## 2019-12-27 NOTE — Congregational Nurse Program (Signed)
  Dept: [phone]   Congregational Nurse Program Note  Date of Encounter: 12/27/2019  Past Medical History: No past medical history on file.  Encounter Details:  Client came in for blood pressure check. Same done. Nicole Cella Vondra Aldredge RN BSN PCCN  [phone]

## 2020-02-29 ENCOUNTER — Other Ambulatory Visit: Payer: Self-pay

## 2020-02-29 ENCOUNTER — Telehealth: Payer: Self-pay

## 2020-02-29 DIAGNOSIS — R7309 Other abnormal glucose: Secondary | ICD-10-CM

## 2020-02-29 LAB — GLUCOSE, POCT (MANUAL RESULT ENTRY): POC Glucose: 102 mg/dl — AB (ref 70–99)

## 2020-02-29 NOTE — Telephone Encounter (Signed)
Appointment scheduled with PCP per patient request. Patient would like annual physical exam. Appointment scheduled for February 24th 11am  Nicole Cella Maigen Mozingo RN BSn [facility]  [facility] [phone]-office

## 2020-02-29 NOTE — Congregational Nurse Program (Signed)
Patient's blood pressure, blood sugar and weight checked this visit. Wants appointment to see PCP for annual physical exam. Will schedule and inform patient of date.  Arman Bogus RN BSn PCCN  [facility] [phone]

## 2020-03-02 ENCOUNTER — Telehealth: Payer: Self-pay

## 2020-03-02 NOTE — Telephone Encounter (Signed)
Patient confirmed and agreed to the set  date and time of the appointment.  Arman Bogus RN BSn PCCN  [facility] [phone]-office

## 2020-03-07 NOTE — Congregational Nurse Program (Signed)
Patient came in today for help with understanding his immigration paperwork and BP check. Currently represented by attorney from [place].  BP 141/84 mmHg.  He received voter registration in the mail, but citizenship paperwork is still pending.   His next PCP visit is on Feb 24th at 11am for a physical. He would like to get his COVID booster vaccination at that visit. Last vaccine dose on 04/05/2019.  Now feeling strong enough to work and is starting to look for work.   Arman Bogus RN BSn PCCN  [facility] [phone]-office

## 2020-03-13 NOTE — Congregational Nurse Program (Signed)
Patient seen today for BP recheck. Unable to see notes from patient's most recent PCP visit. Will continue to try and find out who PCP is.   BP today is 146/88.Patient reports watching his diet and walking frequently for exercise.   Will continue to monitor BP.   Arman Bogus RN BSn PCCN  [facility] [phone]

## 2020-04-03 ENCOUNTER — Telehealth: Payer: Self-pay

## 2020-04-03 NOTE — Telephone Encounter (Signed)
Patient brought letter from primary care provider to [facility] office requesting follow up labs. Called to set up appointment for labs and also 1 month follow up.   Arman Bogus RN BSn PCCN  [facility] [phone] [phone]

## 2020-04-03 NOTE — Congregational Nurse Program (Signed)
Checked patient's blood pressure and heart rate. Assisted with medical bill question. Called insurance to facilitate resolution of billing issue. Made appointment with client to bring paperwork to office to resolve billing issue. Assisted to make follow up appointment for labs 3/21 and 1 month appointment 3/31 with provider. Educated patient that he needs to fast and drink plenty of water prior 3/21 appointment.   Arman Bogus RN BSn PCCN  [facility] [phone]

## 2020-04-03 NOTE — Telephone Encounter (Signed)
Made appointment for patient to go to [employer] office for assistance with citizenship  Arman Bogus RN BSn PCCN  [facility] [phone]-office

## 2020-04-10 ENCOUNTER — Telehealth: Payer: Self-pay

## 2020-04-10 NOTE — Telephone Encounter (Signed)
Contacted Client to coordinate ride to  [employer] for [id number].   Arman Bogus RN BSn PCCN  [facility] [phone]

## 2020-04-17 NOTE — Congregational Nurse Program (Signed)
  Dept: [phone]   Congregational Nurse Program Note  Date of Encounter: 04/17/2020  Past Medical History: No past medical history on file.  Encounter Details:  Took blood pressure, improved. Patient confirmed he went to lab draw on 3/21 as planned and is aware of appointment on 04/19/20.   Arman Bogus RN BSn P[facility]N  [facility] [phone]-office

## 2020-04-18 ENCOUNTER — Telehealth: Payer: Self-pay

## 2020-04-18 NOTE — Telephone Encounter (Signed)
Patient would like to reschedule tomorrow appointment for a later date.  Same rescheduled for 04/30/2020 10:45.  Arman Bogus RN BSn PCCN  [facility] [phone]

## 2020-05-09 NOTE — Congregational Nurse Program (Signed)
  Dept: [phone]   Congregational Nurse Program Note  Date of Encounter: 05/09/2020  Past Medical   Encounter Details:   Patient came in for blood pressure. Same done 143/86. He has started to take new blood pressure medication Norvasc 5mg  daily. He reports to be taking as prescribed.  RN BSn PCCN  [facility] [phone]-office

## 2020-08-08 NOTE — Congregational Nurse Program (Signed)
  Dept: [phone]   Congregational Nurse Program Note  Date of Encounter: 08/08/2020  Past Medical History: No past medical history on file.  Encounter Details:  Patient requested blood pressure check. 139/78. Patient said he ran out of norvasc in May and has not taken since. Educated patient to take medication regularly and not to stop without talking to PCP. Called [facility] to have prescription refilled for pick-up tomorrow. Patient verbalized understanding of plan and will make appointment with PCP in the next month to check HTN.  Regino Schultze, RN Congregational Nursing

## 2020-10-22 IMAGING — CR DG KNEE COMPLETE 4+V*L*
4 series · 4 of 4 positions shown · non-contrast
Comparison: None.

CLINICAL DATA: Bilateral knee pain for many years. No known injury.
Evaluate for degenerative change.

EXAM:
LEFT KNEE - COMPLETE 4+ VIEW

[w knee ap left]
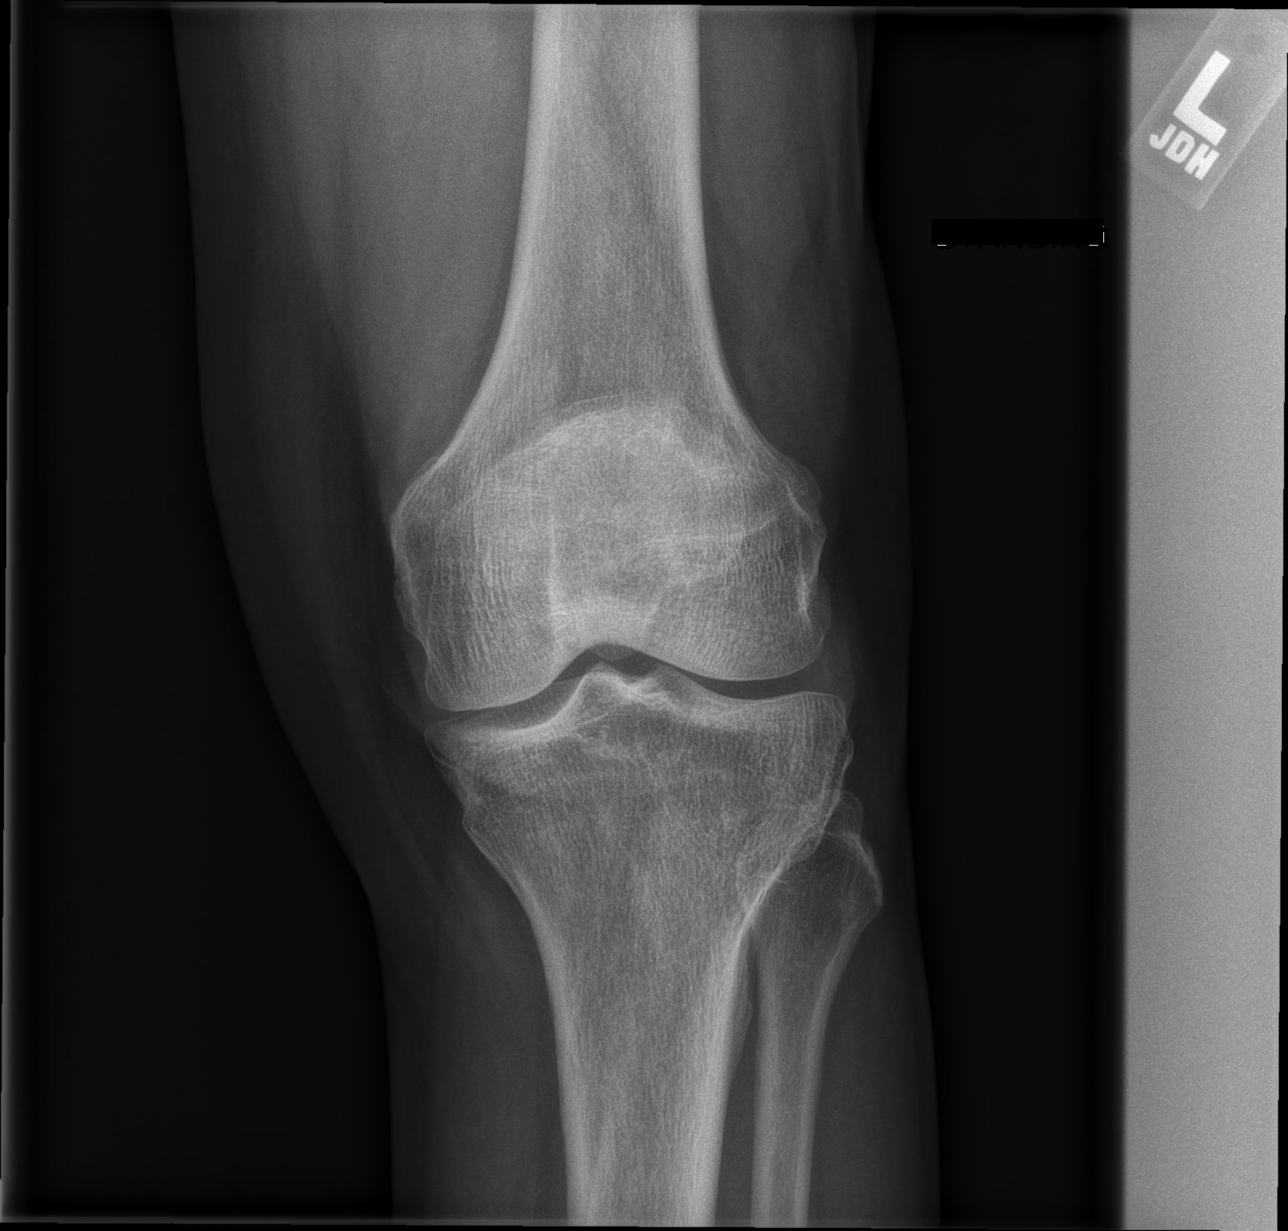

[w knee lat left]
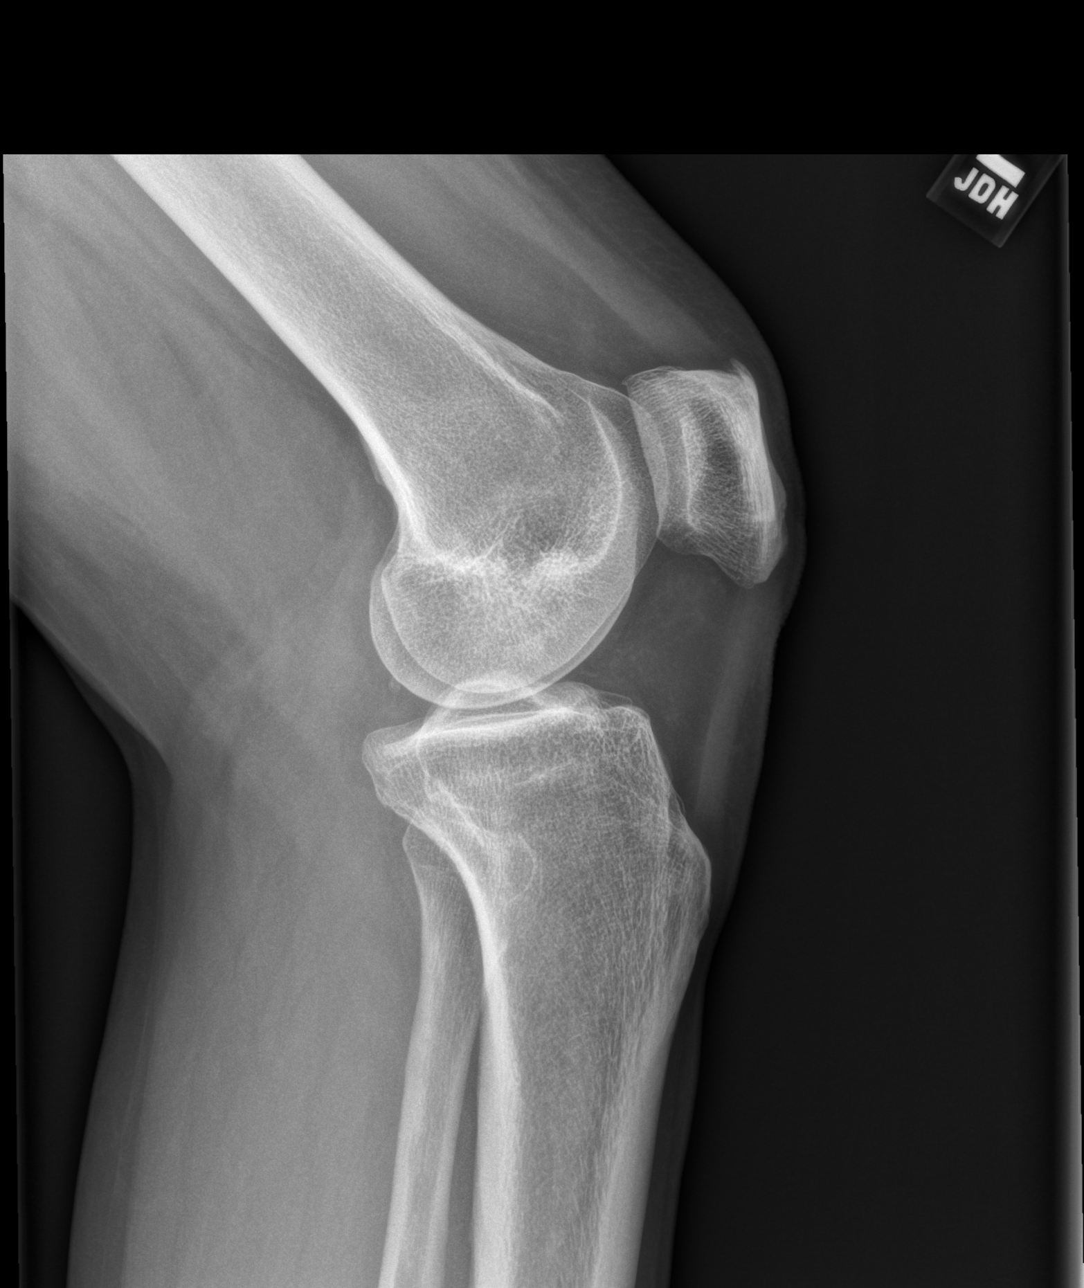

[x knee sunrise left]
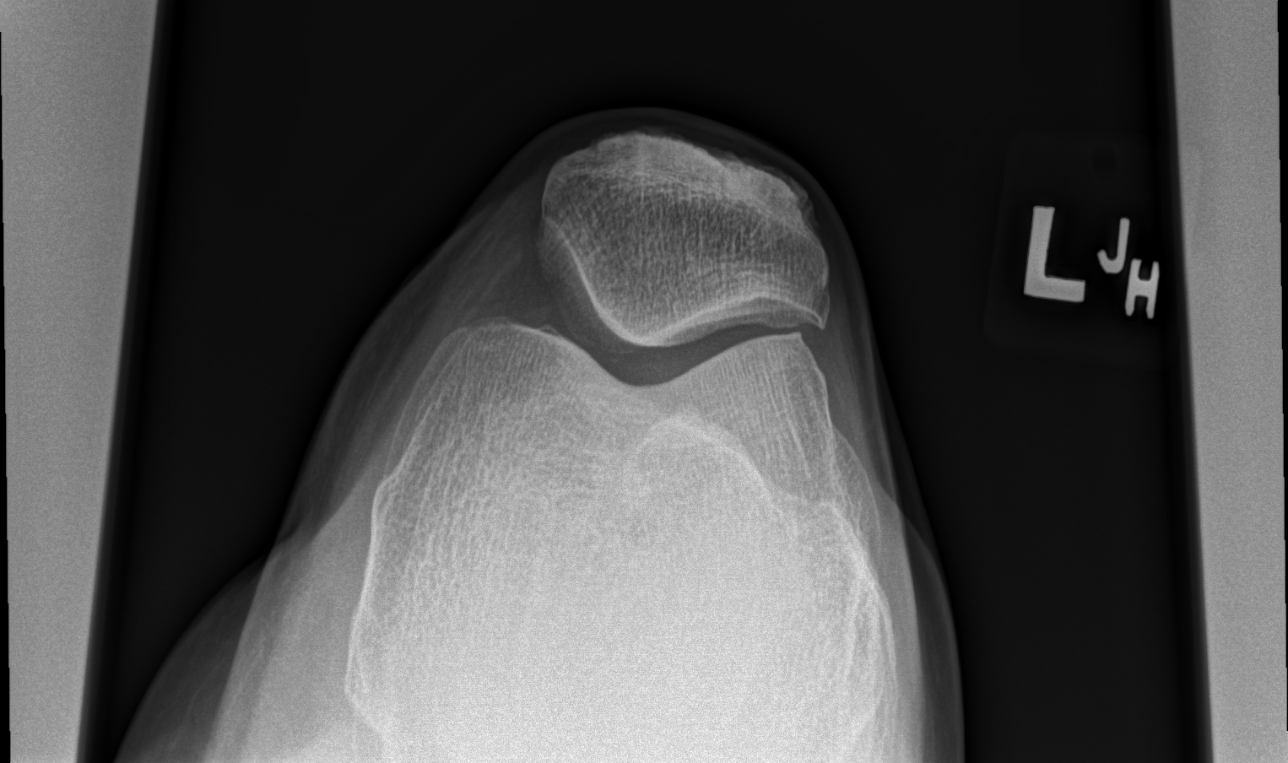

[x knee tunnel left]
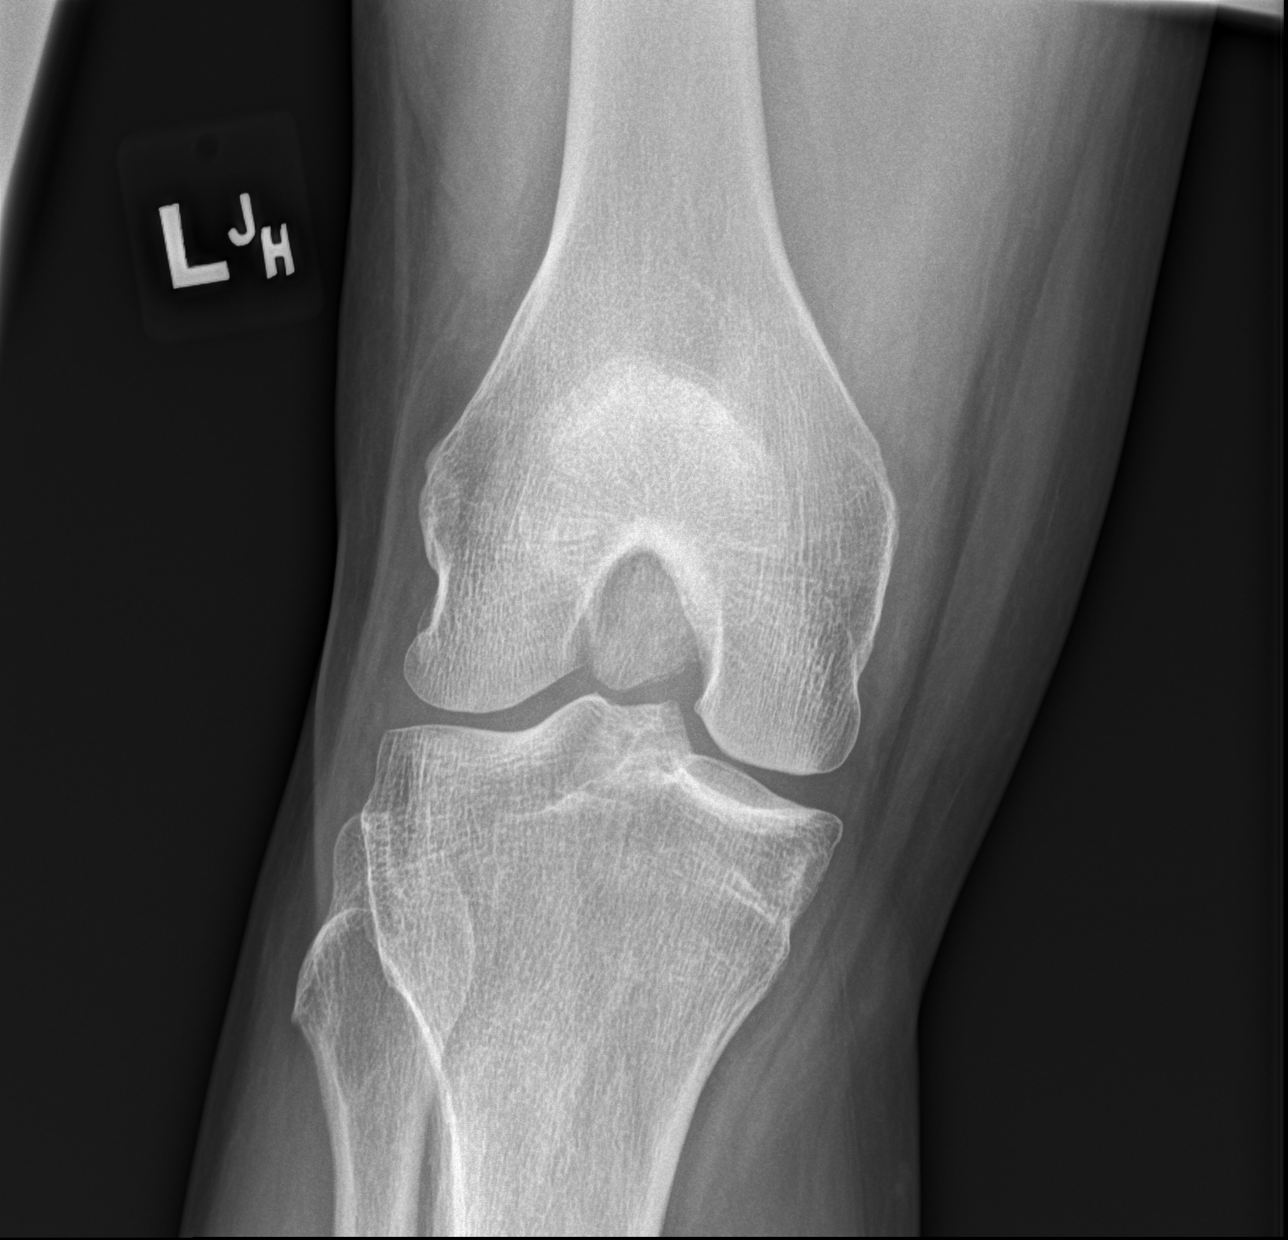

[4 of 4 positions shown; findings below may reference images not displayed]

FINDINGS: No fracture or dislocation. Mild degenerative change of the
patellofemoral joint with joint space loss, articular surface
irregularity and osteophytosis. No evidence of chondrocalcinosis.
Minimal enthesopathic change involving the superior pole of the
patella. No joint effusion. Regional soft tissues appear normal.
IMPRESSION: Mild degenerative change of the patellofemoral joint.

## 2020-12-25 ENCOUNTER — Other Ambulatory Visit (HOSPITAL_COMMUNITY): Payer: Self-pay

## 2020-12-25 ENCOUNTER — Other Ambulatory Visit: Payer: Self-pay

## 2020-12-25 NOTE — Congregational Nurse Program (Signed)
  Dept: [phone]   Congregational Nurse Program Note  Date of Encounter: 12/25/2020  Past Medical History: No past medical history on file.  Encounter Details:  Patient came in for blood pressure check. He has run out of BP medications. He also missed appointment due to work schedule. I have rescheduled him on December 14th and called [facility] for medication refill.  Nicole Cella Grisell Bissette RN BSn P[facility]  [facility] [phone] [phone]
